# Patient Record
Sex: Male | Born: 1995 | Race: Black or African American | Hispanic: No | Marital: Single | Smoking: Current every day smoker
Health system: Southern US, Community
[De-identification: ages and names within clinical notes are randomized; demographics above are authoritative.]

---
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
## 2015-06-26 ENCOUNTER — Ambulatory Visit (INDEPENDENT_AMBULATORY_CARE_PROVIDER_SITE_OTHER): Payer: Managed Care, Other (non HMO) | Admitting: Family Medicine

## 2015-06-26 VITALS — BP 110/64 | HR 63 | Temp 98.9°F | Resp 18 | Ht 70.5 in | Wt 186.4 lb

## 2015-06-26 DIAGNOSIS — R197 Diarrhea, unspecified: Secondary | ICD-10-CM

## 2015-06-26 DIAGNOSIS — M791 Myalgia, unspecified site: Secondary | ICD-10-CM

## 2015-06-26 MED ORDER — LOPERAMIDE HCL 2 MG PO TABS: 2.0000 mg | ORAL_TABLET | Freq: Four times a day (QID) | ORAL | Status: AC | PRN

## 2015-06-26 MED ORDER — CIPROFLOXACIN HCL 250 MG PO TABS: 250.0000 mg | ORAL_TABLET | Freq: Two times a day (BID) | ORAL | Status: AC

## 2015-06-26 NOTE — Progress Notes (Signed)
This is a 19 year old is brought in by his mother because of 2 weeks of myalgia, headache, and diarrhea. He finds that whenever he eats anything, he has immediate diarrhea. He's noticed no blood in his stool. He's had no history of diarrhea, and he denies fever, sweats, chills at night. He also has minimal abdominal pain or cramps.  Patient has had mild cough and mild sore throat.  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 No other members of the family have this man's symptoms.  Patient was last in [place] 7 years ago.  He likes to play soccer. At one time he was told he had a murmur and was disqualifying from sports.  Objective: Very healthy-appearing 19 year old man in no acute distress HEENT: Unremarkable Neck: Supple no adenopathy Chest: Clear Heart: Regular, no murmur, no gallop or rub Abdomen: Active bowel sounds, no HSM, no mass, namely tender in the lower abdomen  Assessment:  Acute and persistent diarrhea with systemic signs    ICD-9-CM ICD-10-CM   1. Diarrhea, unspecified type 787.91 R19.7 Gastrointestinal Pathogen Panel PCR     ciprofloxacin (CIPRO) 250 MG tablet     loperamide (IMODIUM A-D) 2 MG tablet  2. Myalgia 729.1 M79.1 Gastrointestinal Pathogen Panel PCR     Signed, Elvina Sidle, MD

## 2015-06-26 NOTE — Patient Instructions (Signed)
Avoid heavy foods such as gravies or sauces.  Also, minimize milk and cheese.  Instead, stick to water based soups and clear liquids.   We should have results of the test in 3-4 days.  I have prescribed antibiotic and anti-diarrheal medicine.

## 2015-06-29 LAB — GASTROINTESTINAL PATHOGEN PANEL PCR
C. difficile Tox A/B, PCR: NEGATIVE
Campylobacter, PCR: NEGATIVE
Cryptosporidium, PCR: NEGATIVE
E coli (ETEC) LT/ST PCR: NEGATIVE
E coli (STEC) stx1/stx2, PCR: NEGATIVE
E coli 0157, PCR: NEGATIVE
Giardia lamblia, PCR: NEGATIVE
Norovirus, PCR: NEGATIVE
Rotavirus A, PCR: NEGATIVE
Salmonella, PCR: NEGATIVE
Shigella, PCR: NEGATIVE

## 2015-07-09 ENCOUNTER — Ambulatory Visit (INDEPENDENT_AMBULATORY_CARE_PROVIDER_SITE_OTHER): Payer: Managed Care, Other (non HMO) | Admitting: Physician Assistant

## 2015-07-09 VITALS — BP 104/60 | HR 89 | Temp 98.0°F | Resp 14 | Ht 71.0 in | Wt 183.4 lb

## 2015-07-09 DIAGNOSIS — R41840 Attention and concentration deficit: Secondary | ICD-10-CM | POA: Diagnosis not present

## 2015-07-09 MED ORDER — BUPROPION HCL ER (XL) 150 MG PO TB24: 150.0000 mg | ORAL_TABLET | Freq: Every day | ORAL | Status: AC

## 2015-07-09 NOTE — Progress Notes (Signed)
Patient ID: Jesus Olsen, male    DOB: 09/12/1996, 19 y.o.   MRN: [id number]  PCP: No PCP Per Patient  Subjective:   Chief Complaint  Patient presents with  . Stress  . Difficulty Focusing  . Depression    See screening    HPI Presents for evaluation of difficulty concentrating.  He reports that he has been struggling in school since about the 10th grade. He is easily distracted, inattentive, difficulty focusing on any assignment to completion. He feels a lot of pressure from his family to do well in school, and it's been moreso since his grade dropped in the 10th grade. He graduated from [employer] and now attends [employer]. 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 He is up at 6 am to help his younger brothers get ready for school (4, 188, 1413; his mother takes care of the 76 month old). He then goes to school, and studies until work. 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  He spends as much time as he can in [employer], in between classes, and in between school and work. He notes that he is so easily distracted that he has to turn off his phone, but he still finds himself daydreaming and watching videos on the computer. Last week he spent 12 consecutive hours in [employer], but only completed one task, that should have taken only 10 minutes.  No thoughts of harming himself or anyone else.    [facility] 07/09/2015 07/09/2015 06/26/2015  Decreased Interest 2 2 0  Down, Depressed, Hopeless 2 2 0  PHQ - 2 Score 4 4 0  Altered sleeping 2 3 -  Tired, decreased energy 3 3 -  Change in appetite 3 3 -  Feeling bad or failure about yourself  3 3 -  Trouble concentrating 3 2 -  Moving slowly or fidgety/restless 0 2 -  Suicidal thoughts 0 0 -  PHQ-9 Score 18 20 -  Difficult doing  work/chores Very difficult Very difficult -    GAD 7 : Generalized Anxiety Score 07/09/2015  Nervous, Anxious, on Edge 2  Control/stop worrying 2  Worry too much - different things 2  Trouble relaxing 1  Restless 1  Easily annoyed or irritable 2  Afraid - awful might happen 0  Total GAD 7 Score 10  Anxiety Difficulty Not difficult at all     Review of Systems  Constitutional: Positive for fatigue. Negative for fever, chills, diaphoresis, activity change, appetite change and unexpected weight change.  HENT: Negative.   Eyes: Negative.   Respiratory: Positive for chest tightness. Negative for apnea, cough, choking, shortness of breath, wheezing and stridor.   Cardiovascular: Positive for palpitations (heart beat awareness). Negative for chest pain and leg swelling.  Gastrointestinal: Negative.   Endocrine: Negative.   Genitourinary: Negative.   Musculoskeletal: Negative.   Skin: Negative.   Allergic/Immunologic: Negative.   Neurological: Negative.   Hematological: Negative.   Psychiatric/Behavioral: Positive for dysphoric mood and decreased concentration. Negative for suicidal ideas, behavioral problems, confusion, sleep disturbance, self-injury and agitation. The patient is nervous/anxious. The patient is not hyperactive.        There are no active problems to display for this patient.    Prior to Admission medications   Medication Sig Start Date End Date Taking? Authorizing  Provider  ciprofloxacin (CIPRO) 250 MG tablet Take 1 tablet (250 mg total) by mouth 2 (two) times daily. 06/26/15  Yes Elvina Sidle, MD  ibuprofen (ADVIL,MOTRIN) 200 MG tablet Take 400 mg by mouth every 6 (six) hours as needed for moderate pain.    Historical Provider, MD  ibuprofen (ADVIL,MOTRIN) 600 MG tablet Take 1 tablet (600 mg total) by mouth every 6 (six) hours as needed. Patient not taking: Reported on 06/26/2015 10/04/13   Tatyana Kirichenko, PA-C  loperamide (IMODIUM A-D) 2 MG tablet Take  1 tablet (2 mg total) by mouth 4 (four) times daily as needed for diarrhea or loose stools. Patient not taking: Reported on 07/09/2015 06/26/15   Elvina Sidle, MD  traMADol (ULTRAM) 50 MG tablet Take 1 tablet (50 mg total) by mouth every 6 (six) hours as needed. Patient not taking: Reported on 06/26/2015 10/04/13   Tatyana Kirichenko, PA-C     No Known Allergies     Objective:  Physical Exam  Constitutional: He is oriented to person, place, and time. Vital signs are normal. He appears well-developed and well-nourished. He is active and cooperative. No distress.  BP 104/60 mmHg  Pulse 89  Temp(Src) 98 F (36.7 C) (Oral)  Resp 14  Ht  (1.803 m)  Wt 183 lb 6.4 oz (83.19 kg)  BMI 25.59 kg/m2  SpO2 98%  HENT:  Head: Normocephalic and atraumatic.  Right Ear: Hearing normal.  Left Ear: Hearing normal.  Eyes: Conjunctivae are normal. No scleral icterus.  Neck: Normal range of motion. Neck supple. No thyromegaly present.  Cardiovascular: Normal rate, regular rhythm and normal heart sounds.   Pulses:      Radial pulses are 2+ on the right side, and 2+ on the left side.  Pulmonary/Chest: Effort normal and breath sounds normal.  Lymphadenopathy:       Head (right side): No tonsillar, no preauricular, no posterior auricular and no occipital adenopathy present.       Head (left side): No tonsillar, no preauricular, no posterior auricular and no occipital adenopathy present.    He has no cervical adenopathy.       Right: No supraclavicular adenopathy present.       Left: No supraclavicular adenopathy present.  Neurological: He is alert and oriented to person, place, and time. No sensory deficit.  Skin: Skin is warm, dry and intact. No rash noted. No cyanosis or erythema. Nails show no clubbing.  Psychiatric: He has a normal mood and affect. His speech is normal and behavior is normal. Judgment and thought content normal. Cognition and memory are normal.           Assessment &  Plan:   1. Attention and concentration deficit Trial of Wellbutrin which may help his mood and improve concentration/attention. Recommended that he go to the counseling center at school and schedule an appointment. He may benefit from counseling, as in psychotherapy, or perhaps just some study skills and coaching. Encouraged him to seek a job that allows him to get at least 8 hours of sleep, as he is likely also experiencing the effects of chronic sleep deprivation, which may be at the root of all his complaints. - buPROPion (WELLBUTRIN XL) 150 MG 24 hr tablet; Take 1 tablet (150 mg total) by mouth daily.  Dispense: 30 tablet; Refill: 3   Return in about 4 weeks (around 08/06/2015) for re-evaluation.   Fernande Bras, PA-C Physician Assistant-Certified [facility]

## 2015-07-09 NOTE — Patient Instructions (Signed)
Take the Wellbutrin each morning. Go to the counseling center at school and schedule an appointment to meet with one of the counselors.

## 2015-08-14 ENCOUNTER — Ambulatory Visit: Payer: Managed Care, Other (non HMO) | Admitting: Physician Assistant

## 2016-02-17 ENCOUNTER — Emergency Department (HOSPITAL_COMMUNITY)
Admission: EM | Admit: 2016-02-17 | Discharge: 2016-02-17 | Disposition: A | Discharge status: Home / Self Care | Payer: Medicaid Other | Attending: Emergency Medicine | Admitting: Emergency Medicine

## 2016-02-17 ENCOUNTER — Encounter (HOSPITAL_COMMUNITY): Payer: Self-pay | Admitting: Nurse Practitioner

## 2016-02-17 ENCOUNTER — Emergency Department (HOSPITAL_COMMUNITY): Payer: Medicaid Other

## 2016-02-17 DIAGNOSIS — Y9366 Activity, soccer: Secondary | ICD-10-CM | POA: Insufficient documentation

## 2016-02-17 DIAGNOSIS — W500XXA Accidental hit or strike by another person, initial encounter: Secondary | ICD-10-CM | POA: Insufficient documentation

## 2016-02-17 DIAGNOSIS — Y92322 Soccer field as the place of occurrence of the external cause: Secondary | ICD-10-CM | POA: Insufficient documentation

## 2016-02-17 DIAGNOSIS — F17200 Nicotine dependence, unspecified, uncomplicated: Secondary | ICD-10-CM | POA: Insufficient documentation

## 2016-02-17 DIAGNOSIS — S99921A Unspecified injury of right foot, initial encounter: Secondary | ICD-10-CM | POA: Insufficient documentation

## 2016-02-17 DIAGNOSIS — Y999 Unspecified external cause status: Secondary | ICD-10-CM | POA: Insufficient documentation

## 2016-02-17 MED ORDER — NAPROXEN 250 MG PO TABS: 500.0000 mg | ORAL_TABLET | Freq: Once | ORAL | Status: DC

## 2016-02-17 MED ORDER — PREDNISONE 10 MG (21) PO TBPK: 10.0000 mg | ORAL_TABLET | Freq: Every day | ORAL | Status: AC

## 2016-02-17 MED ORDER — METHOCARBAMOL 500 MG PO TABS: 500.0000 mg | ORAL_TABLET | Freq: Two times a day (BID) | ORAL | Status: AC

## 2016-02-17 MED ORDER — FAMOTIDINE 20 MG PO TABS: 20.0000 mg | ORAL_TABLET | Freq: Two times a day (BID) | ORAL | Status: AC

## 2016-02-17 MED ORDER — NAPROXEN 500 MG PO TABS: 500.0000 mg | ORAL_TABLET | Freq: Two times a day (BID) | ORAL | Status: AC

## 2016-02-17 MED ORDER — METHOCARBAMOL 500 MG PO TABS: 1000.0000 mg | ORAL_TABLET | Freq: Once | ORAL | Status: DC

## 2016-02-17 NOTE — ED Notes (Signed)
Declined W/C at D/C and was escorted to lobby by RN. 

## 2016-02-17 NOTE — Discharge Instructions (Signed)
Mr. Barbaraann Caobdelmaleg M Heide,  Nice meeting you! Please follow-up with orthopedics if you continue to have pain. Return to the emergency department if you develop discolorations (black/blue), numbness/tingling, inability to move foot, new/worsening symptoms. Feel better soon!  S. Lane HackerNicole Atina Feeley, PA-C

## 2016-02-17 NOTE — ED Notes (Signed)
He c/o R foot pain and swelling since he was stepped on with metal cleats yesterday. He tried ice, and icy hot with no relief.

## 2016-02-27 NOTE — ED Provider Notes (Signed)
CSN: [id number]     Arrival date & time 02/17/16  1235 History   First MD Initiated Contact with Patient 02/17/16 1440     Chief Complaint  Patient presents with  . Foot Injury   HPI   Jesus Olsen is a 20 y.o. male presenting with a 1 day history of right foot pain and swelling. He states his right foot was stepped on by another person wearing cleats during a soccer game yesterday. He describes his pain as constant, 8/10 pain scale, nonradiating, achy. He denies fevers, chills, recent travel/surgeries/periods of immobility, numbness/tingling.   History reviewed. No pertinent past medical history. History reviewed. No pertinent past surgical history. Family History  Problem Relation Age of Onset  . Arthritis Father     knee   Social History  Substance Use Topics  . Smoking status: Current Every Day Smoker  . Smokeless tobacco: Current User  . Alcohol Use: No    Review of Systems  Ten systems are reviewed and are negative for acute change except as noted in the HPI  Allergies  Review of patient's allergies indicates no known allergies.  Home Medications   Prior to Admission medications   Medication Sig Start Date End Date Taking? Authorizing Provider  buPROPion (WELLBUTRIN XL) 150 MG 24 hr tablet Take 1 tablet (150 mg total) by mouth daily. 07/09/15   Chelle Jeffery, PA-C  ciprofloxacin (CIPRO) 250 MG tablet Take 1 tablet (250 mg total) by mouth 2 (two) times daily. 06/26/15   Elvina SidleKurt Lauenstein, MD  famotidine (PEPCID) 20 MG tablet Take 1 tablet (20 mg total) by mouth 2 (two) times daily. 02/17/16   Melton KrebsSamantha Nicole Betzabe Bevans, PA-C  ibuprofen (ADVIL,MOTRIN) 200 MG tablet Take 400 mg by mouth every 6 (six) hours as needed for moderate pain.    Historical Provider, MD  ibuprofen (ADVIL,MOTRIN) 600 MG tablet Take 1 tablet (600 mg total) by mouth every 6 (six) hours as needed. Patient not taking: Reported on 06/26/2015 10/04/13   Tatyana Kirichenko, PA-C  loperamide (IMODIUM A-D) 2  MG tablet Take 1 tablet (2 mg total) by mouth 4 (four) times daily as needed for diarrhea or loose stools. Patient not taking: Reported on 07/09/2015 06/26/15   Elvina SidleKurt Lauenstein, MD  methocarbamol (ROBAXIN) 500 MG tablet Take 1 tablet (500 mg total) by mouth 2 (two) times daily. 02/17/16   Melton KrebsSamantha Nicole Valentina Alcoser, PA-C  naproxen (NAPROSYN) 500 MG tablet Take 1 tablet (500 mg total) by mouth 2 (two) times daily. 02/17/16   Melton KrebsSamantha Nicole Lether Tesch, PA-C  predniSONE (STERAPRED UNI-PAK 21 TAB) 10 MG (21) TBPK tablet Take 1 tablet (10 mg total) by mouth daily. Take 6 tabs by mouth daily  for 2 days, then 5 tabs for 2 days, then 4 tabs for 2 days, then 3 tabs for 2 days, 2 tabs for 2 days, then 1 tab by mouth daily for 2 days 02/17/16   Melton KrebsSamantha Nicole Leticia Coletta, PA-C  traMADol (ULTRAM) 50 MG tablet Take 1 tablet (50 mg total) by mouth every 6 (six) hours as needed. Patient not taking: Reported on 06/26/2015 10/04/13   Tatyana Kirichenko, PA-C   BP 112/63 mmHg  Pulse 50  Temp(Src) 98.3 F (36.8 C) (Oral)  Resp 14  SpO2 100% Physical Exam  Constitutional: He appears well-developed and well-nourished. No distress.  HENT:  Head: Normocephalic and atraumatic.  Eyes: Conjunctivae are normal. Right eye exhibits no discharge. Left eye exhibits no discharge. No scleral icterus.  Neck: No tracheal deviation present.  Cardiovascular: Normal rate, regular rhythm, normal heart sounds and intact distal pulses.  Exam reveals no gallop and no friction rub.   No murmur heard. Pulmonary/Chest: Effort normal and breath sounds normal. No respiratory distress.  Abdominal: Soft. Bowel sounds are normal. He exhibits no distension.  Musculoskeletal: Normal range of motion. He exhibits tenderness. He exhibits no edema.  Diffuse, superior right foot tenderness. Strength 5/5 LE. NVI BL.   Neurological: He is alert. Coordination normal.  Skin: Skin is warm and dry. No rash noted. He is not diaphoretic. No erythema.  Psychiatric: He has  a normal mood and affect. His behavior is normal.  Nursing note and vitals reviewed.   ED Course  Procedures  Imaging Review Dg Foot Complete Right  02/17/2016  CLINICAL DATA:  Right foot injury last night. Someone stepped on foot playing soccer. Pain over fourth and fifth metatarsals. EXAM: RIGHT FOOT COMPLETE - 3+ VIEW COMPARISON:  None. FINDINGS: There is no evidence of fracture or dislocation. There is no evidence of arthropathy or other focal bone abnormality. Soft tissues are unremarkable. IMPRESSION: Negative. Electronically Signed   By: Charlett Nose M.D.   On: 02/17/2016 13:59   I have personally reviewed and evaluated these images and lab results as part of my medical decision-making.  MDM   Final diagnoses:  Foot injury, right, initial encounter   Patient X-Ray negative for obvious fracture or dislocation. Pain managed in ED. Pt advised to follow up with orthopedics if symptoms persist for possibility of missed fracture diagnosis. Patient given ace wrap while in ED, conservative therapy recommended and discussed. Patient will be dc home & is agreeable with above plan.  Melton Krebs, PA-C 02/27/16 1436  Melene Plan, DO 02/28/16 1600

## 2017-03-17 ENCOUNTER — Encounter (HOSPITAL_COMMUNITY): Payer: Self-pay

## 2017-03-17 DIAGNOSIS — Y998 Other external cause status: Secondary | ICD-10-CM | POA: Insufficient documentation

## 2017-03-17 DIAGNOSIS — F17200 Nicotine dependence, unspecified, uncomplicated: Secondary | ICD-10-CM | POA: Insufficient documentation

## 2017-03-17 DIAGNOSIS — S1191XA Laceration without foreign body of unspecified part of neck, initial encounter: Secondary | ICD-10-CM | POA: Insufficient documentation

## 2017-03-17 DIAGNOSIS — Z79899 Other long term (current) drug therapy: Secondary | ICD-10-CM | POA: Insufficient documentation

## 2017-03-17 DIAGNOSIS — S0990XA Unspecified injury of head, initial encounter: Secondary | ICD-10-CM | POA: Insufficient documentation

## 2017-03-17 DIAGNOSIS — Y9389 Activity, other specified: Secondary | ICD-10-CM | POA: Insufficient documentation

## 2017-03-17 DIAGNOSIS — W25XXXA Contact with sharp glass, initial encounter: Secondary | ICD-10-CM | POA: Insufficient documentation

## 2017-03-17 DIAGNOSIS — Y92009 Unspecified place in unspecified non-institutional (private) residence as the place of occurrence of the external cause: Secondary | ICD-10-CM | POA: Insufficient documentation

## 2017-03-17 NOTE — ED Notes (Signed)
Bleeding controlled with gauze

## 2017-03-17 NOTE — ED Triage Notes (Signed)
Pt reports he was playing soccer, the ball hit the ceiling light and the ceiling lamp fell onto his head. He has a laceration about 1 inch behind right ear. No LOC.

## 2017-03-18 ENCOUNTER — Emergency Department (HOSPITAL_COMMUNITY): Payer: Medicaid Other

## 2017-03-18 ENCOUNTER — Emergency Department (HOSPITAL_COMMUNITY)
Admission: EM | Admit: 2017-03-18 | Discharge: 2017-03-18 | Disposition: A | Discharge status: Home / Self Care | Payer: Medicaid Other | Attending: Emergency Medicine | Admitting: Emergency Medicine

## 2017-03-18 DIAGNOSIS — S1191XA Laceration without foreign body of unspecified part of neck, initial encounter: Secondary | ICD-10-CM

## 2017-03-18 MED ORDER — LIDOCAINE HCL (PF) 1 % IJ SOLN
30.0000 mL | Freq: Once | INTRAMUSCULAR | Status: AC
  Administered 2017-03-18: 30 mL
  Filled 2017-03-18: qty 30

## 2017-03-18 NOTE — ED Notes (Signed)
NP aware of HR: 53

## 2017-03-18 NOTE — ED Provider Notes (Signed)
[facility]-EMERGENCY DEPT Provider Note   CSN: [id number] Arrival date & time: 03/17/17  2329     History   Chief Complaint Chief Complaint  Patient presents with  . Head Laceration    HPI Jesus Olsen is a 21 y.o. male.  This 21 year old who was sitting playing video games when other people in the house kicked a soccer ball hitting the lamp/ceiling fixture which broke, causing glass to ring down.  Patient had sustained a laceration behind his right ear Loss of consciousness      History reviewed. No pertinent past medical history.  There are no active problems to display for this patient.   History reviewed. No pertinent surgical history.     Home Medications    Prior to Admission medications   Medication Sig Start Date End Date Taking? Authorizing Provider  buPROPion (WELLBUTRIN XL) 150 MG 24 hr tablet Take 1 tablet (150 mg total) by mouth daily. 07/09/15   Porfirio Oar, PA-C  ciprofloxacin (CIPRO) 250 MG tablet Take 1 tablet (250 mg total) by mouth 2 (two) times daily. 06/26/15   Elvina Sidle, MD  famotidine (PEPCID) 20 MG tablet Take 1 tablet (20 mg total) by mouth 2 (two) times daily. 02/17/16   Melton Krebs, PA-C  ibuprofen (ADVIL,MOTRIN) 200 MG tablet Take 400 mg by mouth every 6 (six) hours as needed for moderate pain.    [provider]  ibuprofen (ADVIL,MOTRIN) 600 MG tablet Take 1 tablet (600 mg total) by mouth every 6 (six) hours as needed. Patient not taking: Reported on 06/26/2015 10/04/13   Jaynie Crumble, PA-C  loperamide (IMODIUM A-D) 2 MG tablet Take 1 tablet (2 mg total) by mouth 4 (four) times daily as needed for diarrhea or loose stools. Patient not taking: Reported on 07/09/2015 06/26/15   Elvina Sidle, MD  methocarbamol (ROBAXIN) 500 MG tablet Take 1 tablet (500 mg total) by mouth 2 (two) times daily. 02/17/16   Melton Krebs, PA-C  naproxen (NAPROSYN) 500 MG tablet Take 1 tablet (500 mg total) by mouth 2  (two) times daily. 02/17/16   Melton Krebs, PA-C  predniSONE (STERAPRED UNI-PAK 21 TAB) 10 MG (21) TBPK tablet Take 1 tablet (10 mg total) by mouth daily. Take 6 tabs by mouth daily  for 2 days, then 5 tabs for 2 days, then 4 tabs for 2 days, then 3 tabs for 2 days, 2 tabs for 2 days, then 1 tab by mouth daily for 2 days 02/17/16   Melton Krebs, PA-C  traMADol (ULTRAM) 50 MG tablet Take 1 tablet (50 mg total) by mouth every 6 (six) hours as needed. Patient not taking: Reported on 06/26/2015 10/04/13   Jaynie Crumble, PA-C    Family History Family History  Problem Relation Age of Onset  . Arthritis Father        knee    Social History Social History  Substance Use Topics  . Smoking status: Current Every Day Smoker  . Smokeless tobacco: Current User  . Alcohol use No     Allergies   Patient has no known allergies.   Review of Systems Review of Systems  Constitutional: Negative for fever.  Eyes: Negative for visual disturbance.  Gastrointestinal: Negative for nausea.  Skin: Positive for wound.  Neurological: Negative for dizziness and headaches.  All other systems reviewed and are negative.    Physical Exam Updated Vital Signs BP 103/61 (BP Location: Right Arm)   Pulse 77   Temp 97.8 F (36.6  C) (Oral)   Resp 20   SpO2 99%   Physical Exam  Constitutional: He appears well-developed and well-nourished. No distress.  HENT:  Head:    Right Ear: External ear normal.  Eyes: Pupils are equal, round, and reactive to light.  Neck: Normal range of motion.  Cardiovascular: Normal rate.   Pulmonary/Chest: Effort normal.  Musculoskeletal: Normal range of motion.  Neurological: He is alert.  Nursing note and vitals reviewed.    ED Treatments / Results  Labs (all labs ordered are listed, but only abnormal results are displayed) Labs Reviewed - No data to display  EKG  EKG Interpretation None       Radiology Ct Head Wo Contrast  Result  Date: 03/18/2017 CLINICAL DATA:  Lamp fell on patient's head.  Initial encounter. EXAM: CT HEAD WITHOUT CONTRAST TECHNIQUE: Contiguous axial images were obtained from the base of the skull through the vertex without intravenous contrast. COMPARISON:  None. FINDINGS: Brain: No evidence of acute infarction, hemorrhage, hydrocephalus, extra-axial collection or mass lesion/mass effect. Prominence of the ventricles and sulci reflects mild cortical volume loss. The brainstem and fourth ventricle are within normal limits. The basal ganglia are unremarkable in appearance. The cerebral hemispheres demonstrate grossly normal gray-white differentiation. No mass effect or midline shift is seen. Vascular: No hyperdense vessel or unexpected calcification. Skull: There is no evidence of fracture; visualized osseous structures are unremarkable in appearance. Sinuses/Orbits: The visualized portions of the orbits are within normal limits. The paranasal sinuses and mastoid air cells are well-aerated. Other: No significant soft tissue abnormalities are seen. IMPRESSION: 1. No evidence of traumatic intracranial injury or fracture. 2. Mild cortical volume loss noted. Electronically Signed   By: Roanna Raider M.D.   On: 03/18/2017 02:17    Procedures .Marland KitchenLaceration Repair Date/Time: 03/18/2017 3:09 AM Performed by: Earley Favor Authorized by: Earley Favor   Consent:    Consent obtained:  Verbal   Consent given by:  Patient   Risks discussed:  Pain and infection   Alternatives discussed:  No treatment Anesthesia (see MAR for exact dosages):    Anesthesia method:  Local infiltration   Local anesthetic:  Lidocaine 1% w/o epi Laceration details:    Location:  Neck   Neck location:  R posterior   Length (cm):  3   Depth (mm):  0.3 Repair type:    Repair type:  Simple Exploration:    Hemostasis achieved with:  Direct pressure   Contaminated: no   Treatment:    Area cleansed with:  Saline   Amount of cleaning:   Standard   Visualized foreign bodies/material removed: no   Skin repair:    Repair method:  Sutures   Suture size:  4-0   Suture material:  Prolene   Suture technique:  Simple interrupted   Number of sutures:  7 Approximation:    Approximation:  Close   Vermilion border: well-aligned   Post-procedure details:    Dressing:  Antibiotic ointment Comments:     This was a studder laceration with a small area of intact skin    (including critical care time)  Medications Ordered in ED Medications  lidocaine (PF) (XYLOCAINE) 1 % injection 30 mL (30 mLs Other Given by Other 03/18/17 0309)     Initial Impression / Assessment and Plan / ED Course  I have reviewed the triage vital signs and the nursing notes.  Pertinent labs & imaging results that were available during my care of the patient were reviewed by me  and considered in my medical decision making (see chart for details).      Sutures to be removed in 10 days  CT Head normal   Final Clinical Impressions(s) / ED Diagnoses   Final diagnoses:  Laceration of neck, initial encounter    New Prescriptions New Prescriptions   No medications on file     Earley FavorSchulz, Jibran Crookshanks, NP 03/18/17 [id number]    Geoffery Lyonselo, Douglas, MD 03/18/17 [phone]

## 2017-03-18 NOTE — ED Notes (Signed)
Applied Bacitracin to sutured laceration on head behind right ear per NP verbal order.

## 2017-03-18 NOTE — Discharge Instructions (Signed)
Your sutures should be removed in 10 days.  Please wash the area with soap and water daily basis as discussed.  You may shower, but I would prefer that you do not sit in a body of water with your head submerged for any length of time if you do happen to go swimming.  Please try to keep your head above water.  If you do, and inadvertently get water from the [facility] or pool into the laceration.  Please wash with soap and water.  Immediately after watch for signs of infection such as redness, pain, swelling, pus. You can have your sutures removed at any urgent care, your primary care doctor

## 2017-11-06 ENCOUNTER — Encounter (HOSPITAL_COMMUNITY): Payer: Self-pay

## 2017-11-06 ENCOUNTER — Other Ambulatory Visit: Payer: Self-pay

## 2017-11-06 ENCOUNTER — Emergency Department (HOSPITAL_COMMUNITY): Payer: Self-pay

## 2017-11-06 DIAGNOSIS — R1084 Generalized abdominal pain: Secondary | ICD-10-CM | POA: Insufficient documentation

## 2017-11-06 LAB — CBC WITH DIFFERENTIAL/PLATELET
BASOS ABS: 0 10*3/uL (ref 0.0–0.1)
BASOS PCT: 1 %
Eosinophils Absolute: 0.1 10*3/uL (ref 0.0–0.7)
Eosinophils Relative: 2 %
HEMATOCRIT: 41.4 % (ref 39.0–52.0)
Hemoglobin: 14 g/dL (ref 13.0–17.0)
Lymphocytes Relative: 49 %
Lymphs Abs: 3.1 10*3/uL (ref 0.7–4.0)
MCH: 28.7 pg (ref 26.0–34.0)
MCHC: 33.8 g/dL (ref 30.0–36.0)
MCV: 85 fL (ref 78.0–100.0)
MONO ABS: 0.5 10*3/uL (ref 0.1–1.0)
Monocytes Relative: 8 %
NEUTROS ABS: 2.5 10*3/uL (ref 1.7–7.7)
Neutrophils Relative %: 40 %
Platelets: 210 10*3/uL (ref 150–400)
RBC: 4.87 MIL/uL (ref 4.22–5.81)
RDW: 13.1 % (ref 11.5–15.5)
WBC: 6.1 10*3/uL (ref 4.0–10.5)

## 2017-11-06 LAB — URINALYSIS, ROUTINE W REFLEX MICROSCOPIC
BILIRUBIN URINE: NEGATIVE
Glucose, UA: NEGATIVE mg/dL
HGB URINE DIPSTICK: NEGATIVE
Ketones, ur: NEGATIVE mg/dL
Leukocytes, UA: NEGATIVE
NITRITE: NEGATIVE
Protein, ur: NEGATIVE mg/dL
SPECIFIC GRAVITY, URINE: 1.035 — AB (ref 1.005–1.030)
pH: 5 (ref 5.0–8.0)

## 2017-11-06 LAB — COMPREHENSIVE METABOLIC PANEL
ALBUMIN: 4.3 g/dL (ref 3.5–5.0)
ALK PHOS: 53 U/L (ref 38–126)
ALT: 9 U/L — ABNORMAL LOW (ref 17–63)
ANION GAP: 10 (ref 5–15)
AST: 19 U/L (ref 15–41)
BILIRUBIN TOTAL: 0.9 mg/dL (ref 0.3–1.2)
BUN: 16 mg/dL (ref 6–20)
CALCIUM: 9.2 mg/dL (ref 8.9–10.3)
CO2: 24 mmol/L (ref 22–32)
Chloride: 105 mmol/L (ref 101–111)
Creatinine, Ser: 1.03 mg/dL (ref 0.61–1.24)
GFR calc non Af Amer: >60 mL/min (ref >60)
GLUCOSE: 98 mg/dL (ref 65–99)
Potassium: 4.3 mmol/L (ref 3.5–5.1)
Sodium: 139 mmol/L (ref 135–145)
TOTAL PROTEIN: 7 g/dL (ref 6.5–8.1)

## 2017-11-06 LAB — LIPASE, BLOOD: LIPASE: 64 U/L — AB (ref 11–51)

## 2017-11-06 NOTE — ED Notes (Signed)
No answer when called for treatment room.  ?

## 2017-11-06 NOTE — ED Notes (Signed)
No answer in waiting room 

## 2017-11-06 NOTE — ED Provider Notes (Signed)
Patient placed in Quick Look pathway, seen and evaluated   Chief Complaint: Generalized abdominal pain, diarrhea, constipation, influenza-like illness  HPI:   Patient presents to the ED for 2 days of generalized abdominal pain.  Does report on and off constipation but now reports diarrhea.  Patient states that he did have a fever at work earlier today.  Patient reports some runny nose, chills and body aches with cough.  Has not taking first fevers prior to arrival.  Denies any urinary symptoms.  Denies any bloody stools.  Denies any vomiting.  Denies any pain right now.  ROS: Abdominal pain, rhinorrhea, cough, sore throat, nausea, vomiting, diarrhea (one)  Physical Exam:   Gen: No distress  Neuro: Awake and Alert  Skin: Warm    Focused Exam: Abdominal exam is benign.  No focal tenderness.  Abdomen is soft without distention.  Bowel sounds are present.  No CVA tenderness.  Lungs clear to auscultation bilaterally.  Heart regular rate and rhythm.  No nuchal rigidity.  No peritonitis.   Initiation of care has begun. The patient has been counseled on the process, plan, and necessity for staying for the completion/evaluation, and the remainder of the medical screening examination   Discussed with the patient that exiting the department prior to completion of the work-up is AMA and there is no guarantee that there are no emergency medical conditions present.     Rise MuLeaphart, Kenneth T, PA-C 11/06/17 1940    Tegeler, Canary Brimhristopher J, MD 11/16/17 [phone]

## 2017-11-06 NOTE — ED Notes (Signed)
No answer for triage.

## 2017-11-06 NOTE — ED Triage Notes (Signed)
Pt states that he is having generalized abd pain for the past two day, along with some constipation and then diarrhea, pt states that he began to have runny nose, chills, body aches, afebrile now.

## 2017-11-07 ENCOUNTER — Emergency Department (HOSPITAL_COMMUNITY)
Admission: EM | Admit: 2017-11-07 | Discharge: 2017-11-07 | Discharge status: Against Medical Advice | Payer: Self-pay | Attending: Emergency Medicine | Admitting: Emergency Medicine

## 2017-11-07 NOTE — ED Notes (Signed)
No answer in waiting room 

## 2019-09-01 ENCOUNTER — Emergency Department (HOSPITAL_COMMUNITY): Admission: EM | Admit: 2019-09-01 | Payer: Self-pay | Source: Home / Self Care

## 2020-06-03 ENCOUNTER — Emergency Department (HOSPITAL_COMMUNITY)
Admission: EM | Admit: 2020-06-03 | Discharge: 2020-06-03 | Disposition: A | Discharge status: Home / Self Care | Payer: Self-pay | Attending: Emergency Medicine | Admitting: Emergency Medicine

## 2020-06-03 ENCOUNTER — Other Ambulatory Visit: Payer: Self-pay

## 2020-06-03 ENCOUNTER — Encounter (HOSPITAL_COMMUNITY): Payer: Self-pay | Admitting: Emergency Medicine

## 2020-06-03 DIAGNOSIS — R1031 Right lower quadrant pain: Secondary | ICD-10-CM | POA: Insufficient documentation

## 2020-06-03 DIAGNOSIS — Z5321 Procedure and treatment not carried out due to patient leaving prior to being seen by health care provider: Secondary | ICD-10-CM | POA: Insufficient documentation

## 2020-06-03 LAB — URINALYSIS, ROUTINE W REFLEX MICROSCOPIC
Bilirubin Urine: NEGATIVE
Glucose, UA: NEGATIVE mg/dL
Hgb urine dipstick: NEGATIVE
Ketones, ur: NEGATIVE mg/dL
Leukocytes,Ua: NEGATIVE
Nitrite: NEGATIVE
Protein, ur: NEGATIVE mg/dL
Specific Gravity, Urine: 1.031 — ABNORMAL HIGH (ref 1.005–1.030)
pH: 6 (ref 5.0–8.0)

## 2020-06-03 NOTE — ED Triage Notes (Signed)
Patient complaints of right flank and RLQ pain. Patient states that it began while he was at a party. Patient is not tender on palpation, denies worsening pain with movement, no fevers or nausea. Patient endorses marijuana and cocaine use tonight. Patient also states he may be constipated.

## 2020-06-03 NOTE — ED Notes (Signed)
Called patient name in lobby for vital sigh recheck no one responded

## 2020-06-03 NOTE — ED Notes (Signed)
Called patient to take back to a room no one responded

## 2020-06-03 NOTE — ED Notes (Signed)
I called patient name to recheck vitals and no one responded 

## 2021-02-04 ENCOUNTER — Emergency Department (HOSPITAL_COMMUNITY)
Admission: EM | Admit: 2021-02-04 | Discharge: 2021-02-04 | Discharge status: Against Medical Advice | Payer: Self-pay | Attending: Emergency Medicine | Admitting: Emergency Medicine

## 2021-02-04 ENCOUNTER — Encounter (HOSPITAL_COMMUNITY): Payer: Self-pay

## 2021-02-04 ENCOUNTER — Other Ambulatory Visit: Payer: Self-pay

## 2021-02-04 ENCOUNTER — Emergency Department (HOSPITAL_COMMUNITY): Payer: Self-pay

## 2021-02-04 DIAGNOSIS — R569 Unspecified convulsions: Secondary | ICD-10-CM | POA: Insufficient documentation

## 2021-02-04 DIAGNOSIS — Z5321 Procedure and treatment not carried out due to patient leaving prior to being seen by health care provider: Secondary | ICD-10-CM | POA: Insufficient documentation

## 2021-02-04 LAB — COMPREHENSIVE METABOLIC PANEL
ALT: 37 U/L (ref 0–44)
AST: 18 U/L (ref 15–41)
Albumin: 4.4 g/dL (ref 3.5–5.0)
Alkaline Phosphatase: 56 U/L (ref 38–126)
Anion gap: 7 (ref 5–15)
BUN: 16 mg/dL (ref 6–20)
CO2: 26 mmol/L (ref 22–32)
Calcium: 9 mg/dL (ref 8.9–10.3)
Chloride: 105 mmol/L (ref 98–111)
Creatinine, Ser: 0.92 mg/dL (ref 0.61–1.24)
GFR, Estimated: >60 mL/min (ref >60)
Glucose, Bld: 99 mg/dL (ref 70–99)
Potassium: 3.8 mmol/L (ref 3.5–5.1)
Sodium: 138 mmol/L (ref 135–145)
Total Bilirubin: 0.7 mg/dL (ref 0.3–1.2)
Total Protein: 7.6 g/dL (ref 6.5–8.1)

## 2021-02-04 LAB — CBC WITH DIFFERENTIAL/PLATELET
Abs Immature Granulocytes: 0.02 10*3/uL (ref 0.00–0.07)
Basophils Absolute: 0 10*3/uL (ref 0.0–0.1)
Basophils Relative: 1 %
Eosinophils Absolute: 0.1 10*3/uL (ref 0.0–0.5)
Eosinophils Relative: 1 %
HCT: 37.5 % — ABNORMAL LOW (ref 39.0–52.0)
Hemoglobin: 11.8 g/dL — ABNORMAL LOW (ref 13.0–17.0)
Immature Granulocytes: 0 %
Lymphocytes Relative: 32 %
Lymphs Abs: 2.5 10*3/uL (ref 0.7–4.0)
MCH: 27.6 pg (ref 26.0–34.0)
MCHC: 31.5 g/dL (ref 30.0–36.0)
MCV: 87.8 fL (ref 80.0–100.0)
Monocytes Absolute: 0.6 10*3/uL (ref 0.1–1.0)
Monocytes Relative: 8 %
Neutro Abs: 4.6 10*3/uL (ref 1.7–7.7)
Neutrophils Relative %: 58 %
Platelets: 236 10*3/uL (ref 150–400)
RBC: 4.27 MIL/uL (ref 4.22–5.81)
RDW: 15.7 % — ABNORMAL HIGH (ref 11.5–15.5)
WBC: 7.8 10*3/uL (ref 4.0–10.5)
nRBC: 0 % (ref 0.0–0.2)

## 2021-02-04 LAB — ETHANOL: Alcohol, Ethyl (B): <10 mg/dL (ref <10)
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
## 2021-02-04 NOTE — ED Notes (Signed)
Pt state he is leaving. Seen walking out front door. Triage rn info

## 2021-07-26 IMAGING — CT CT HEAD W/O CM
3 series · 15 of 47 positions shown, 18 images · non-contrast
Comparison: Prior head CT 03/18/2017.

CLINICAL DATA: Seizure, nontraumatic.

EXAM:
CT HEAD WITHOUT CONTRAST
TECHNIQUE: Contiguous axial images were obtained from the base of the skull
through the vertex without intravenous contrast.

[Series 2: head wo · axial · 0.47mm/px · z∈[+1650,+1775]mm · 9 of 31 slices shown, 12 images]
[im 3/31  brain]
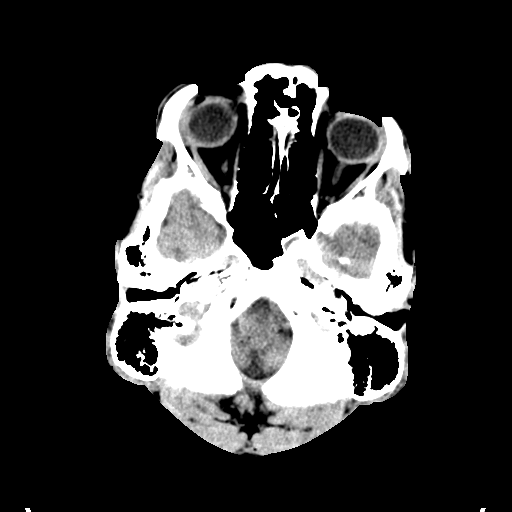
[im 3/31  bone]
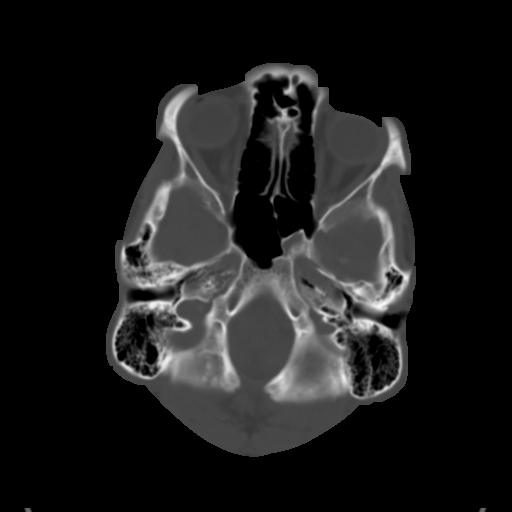
[im 6/31  brain]
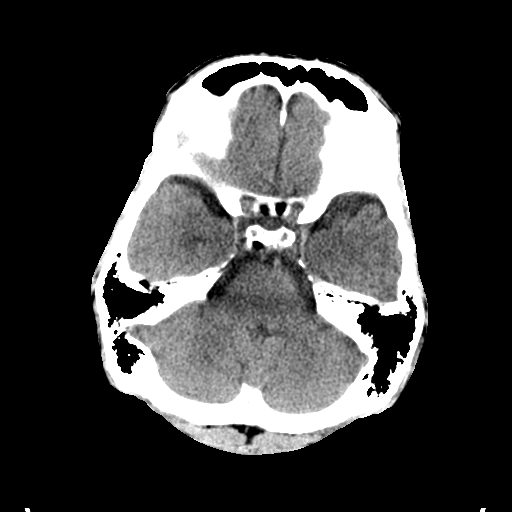
[im 9/31  brain]
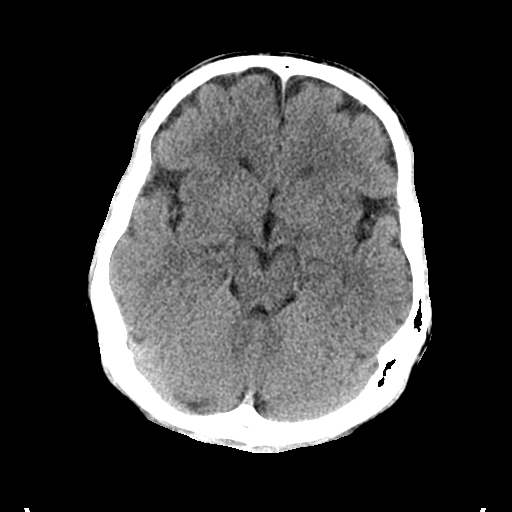
[im 12/31  brain]
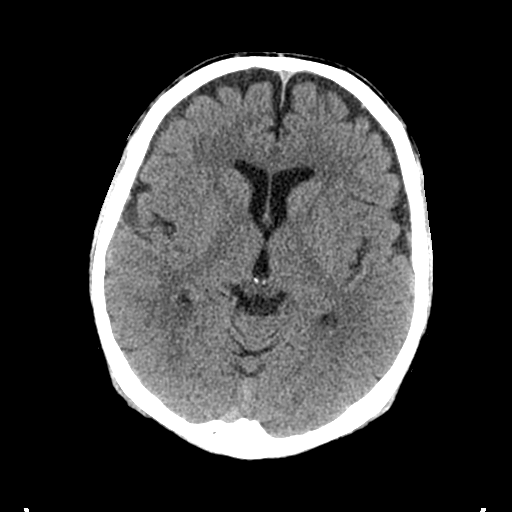
[im 16/31  brain]
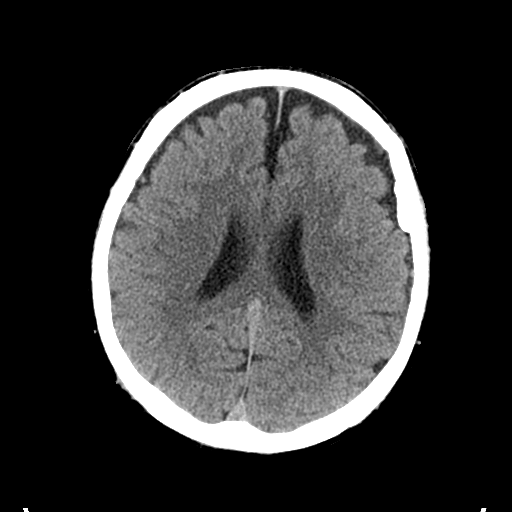
[im 16/31  bone]
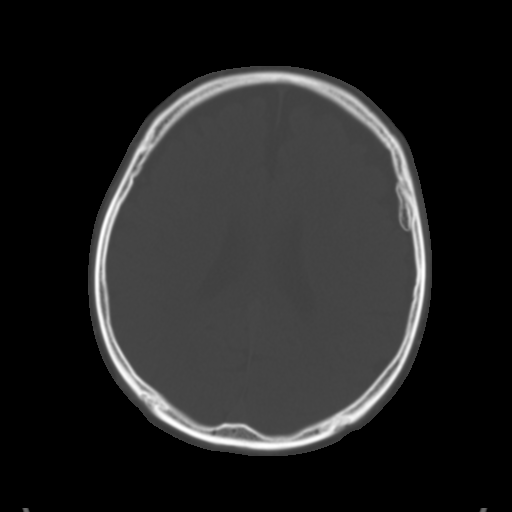
[im 19/31  brain]
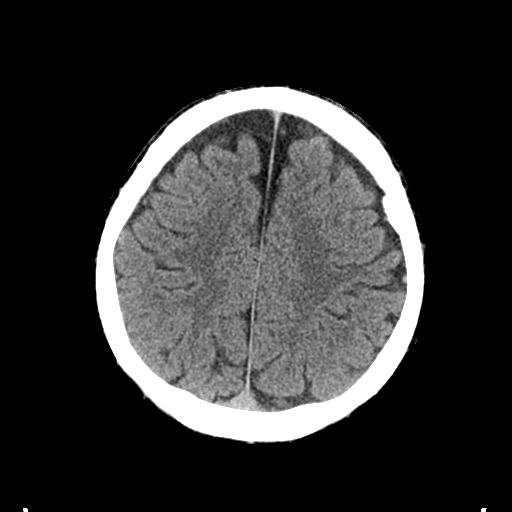
[im 22/31  brain]
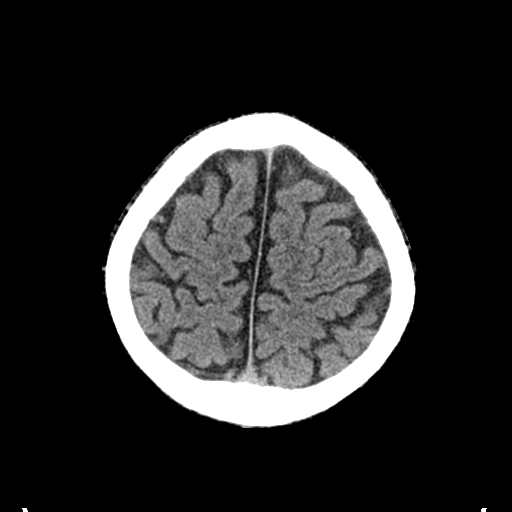
[im 25/31  brain]
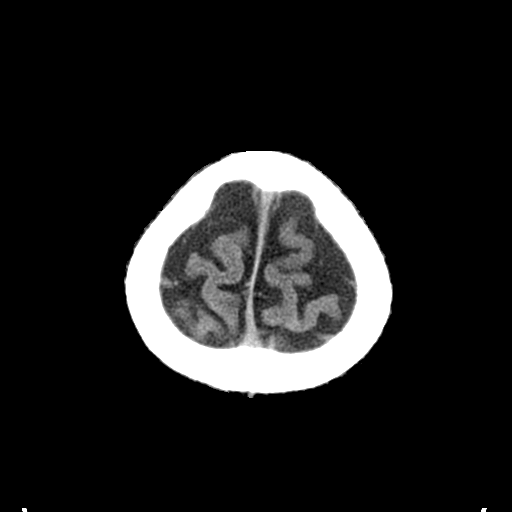
[im 28/31  brain]
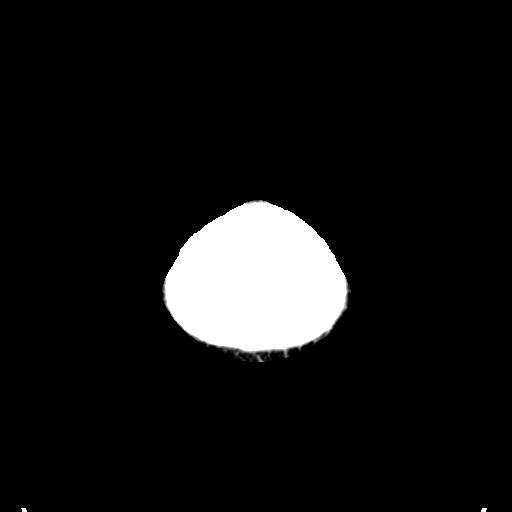
[im 28/31  bone]
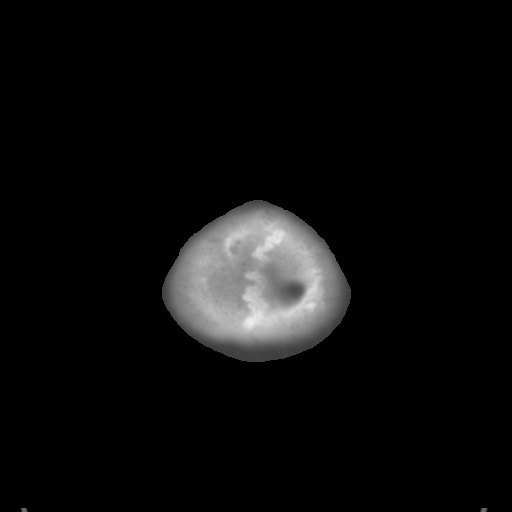

[Series 5: coronal soft tissue · coronal · 0.30mm/px · 3 of 70 slices shown]
[im 24/70  brain]
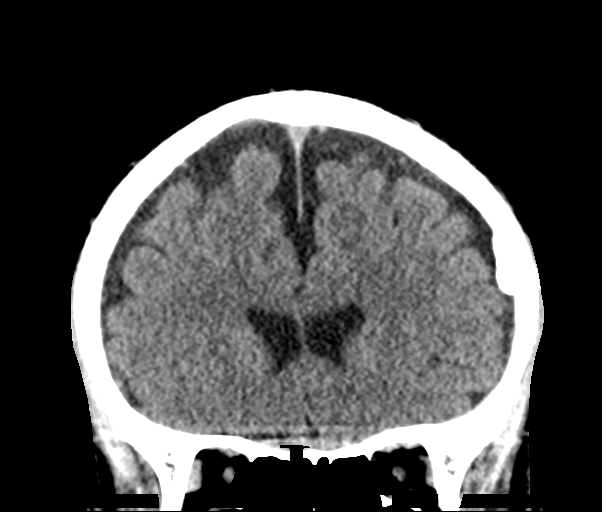
[im 31/70  brain]
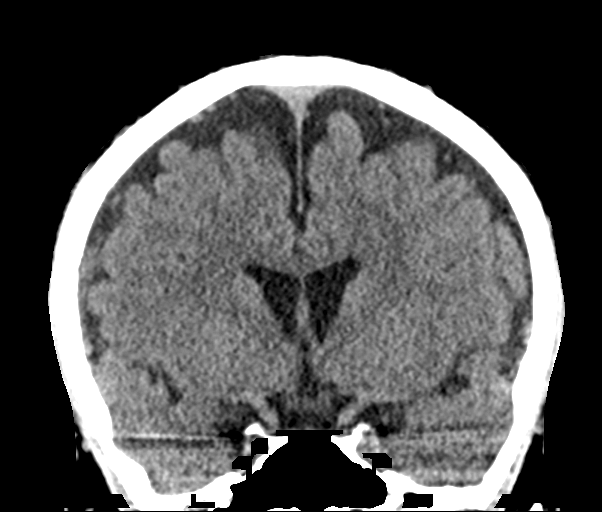
[im 39/70  brain]
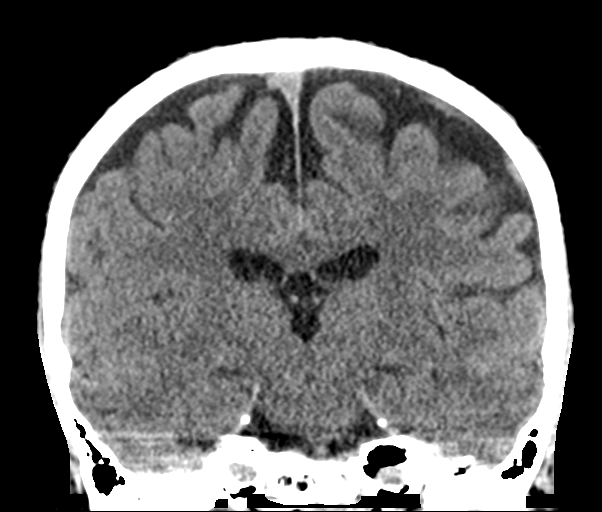

[Series 6: sagittal soft tissue · sagittal · 0.30mm/px · 3 of 61 slices shown]
[im 21/61  brain]
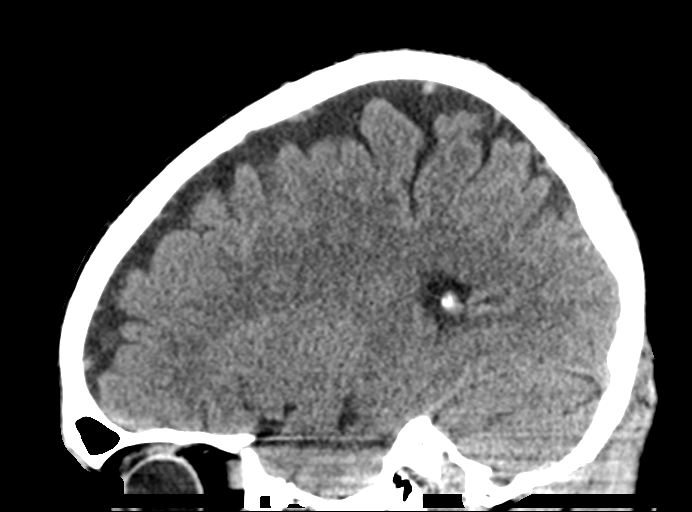
[im 31/61  brain]
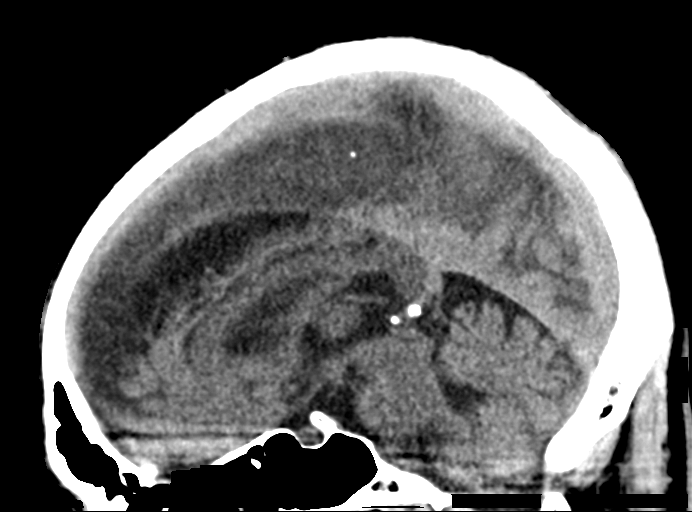
[im 41/61  brain]
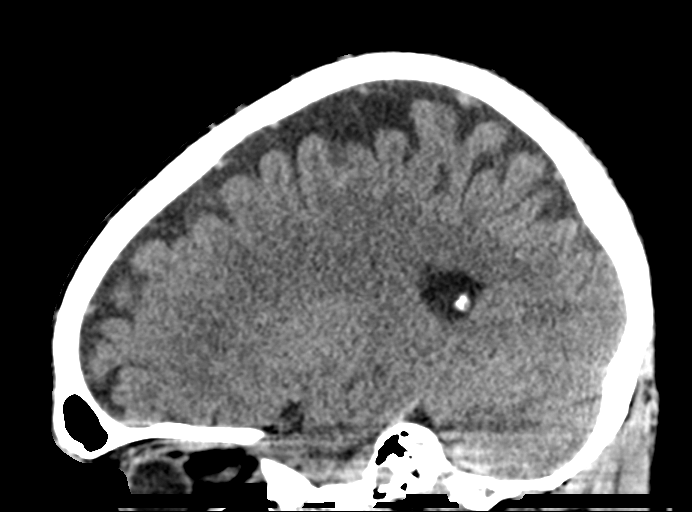

[15 of 47 positions shown; findings below may reference images not displayed]

FINDINGS: Brain:

There is no acute intracranial hemorrhage.

No demarcated cortical infarct.

No extra-axial fluid collection.

No evidence of intracranial mass.

No midline shift.

Somewhat prominent dural calcification overlying the mid left
frontal lobe, unchanged from the head CT of 03/18/2017.

Vascular: No hyperdense vessel.

Skull: Normal. Negative for fracture or focal lesion.

Sinuses/Orbits: Visualized orbits show no acute finding. Trace
bilateral ethmoid sinus mucosal thickening at the imaged levels.
IMPRESSION: Stable non-contrast CT appearance of the brain as compared to
03/18/2017. No evidence of acute intracranial abnormality.

## 2023-05-18 ENCOUNTER — Encounter (HOSPITAL_COMMUNITY): Payer: Self-pay | Admitting: *Deleted

## 2023-05-18 ENCOUNTER — Emergency Department (HOSPITAL_COMMUNITY)
Admission: EM | Admit: 2023-05-18 | Discharge: 2023-05-18 | Disposition: A | Discharge status: Home / Self Care | Payer: BC Managed Care – PPO | Attending: Emergency Medicine | Admitting: Emergency Medicine

## 2023-05-18 ENCOUNTER — Other Ambulatory Visit: Payer: Self-pay

## 2023-05-18 DIAGNOSIS — F17210 Nicotine dependence, cigarettes, uncomplicated: Secondary | ICD-10-CM | POA: Diagnosis not present

## 2023-05-18 DIAGNOSIS — K047 Periapical abscess without sinus: Secondary | ICD-10-CM | POA: Insufficient documentation

## 2023-05-18 DIAGNOSIS — R6884 Jaw pain: Secondary | ICD-10-CM | POA: Diagnosis present

## 2023-05-18 MED ORDER — AMOXICILLIN 500 MG PO CAPS: 500.0000 mg | ORAL_CAPSULE | Freq: Three times a day (TID) | ORAL | 0 refills | Status: AC

## 2023-05-18 MED ORDER — AMOXICILLIN 500 MG PO CAPS
500.0000 mg | ORAL_CAPSULE | Freq: Once | ORAL | Status: AC
  Administered 2023-05-18: 500 mg via ORAL
  Filled 2023-05-18: qty 1

## 2023-05-18 NOTE — ED Triage Notes (Signed)
States he woke up Sunday am with pain and swelling to right lower jaw, this am the pain woke him up and the swelling is worse.

## 2023-05-18 NOTE — ED Provider Notes (Signed)
[facility]EMERGENCY DEPT [facility] Emergency Department Provider Note MRN:  [id number]  Arrival date & time: 05/18/23     Chief Complaint   Jaw Pain   History of Present Illness   Jesus Olsen is a 27 y.o. year-old male with no pertinent past medical history presenting to the ED with chief complaint of general pain.  Swelling and discomfort to the right jaw.  Started with some pain to the right back lower tooth.  No fever.  Review of Systems  A thorough review of systems was obtained and all systems are negative except as noted in the HPI and PMH.   Patient's Health History   History reviewed. No pertinent past medical history.  History reviewed. No pertinent surgical history.  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 Came to the [place] in 2006.   Lives with his parents and 4 younger brothers.   Social Determinants of Health   Financial Resource Strain: Not on file  Food Insecurity: Not on file  Transportation Needs: Not on file  Physical Activity: Not on file  Stress: Not on file  Social Connections: Not on file  Intimate Partner Violence: Not on file     Physical Exam    Vitals:   05/18/23 0332  BP: 121/64  Pulse: (!) 58  Resp: 18  Temp: 98.7 F (37.1 C)  SpO2: 98%    CONSTITUTIONAL: Well-appearing, NAD NEURO/PSYCH:  Alert and oriented x 3, no focal deficits EYES:  eyes equal and reactive ENT/NECK:  no LAD, no JVD CARDIO: Regular rate, well-perfused, normal S1 and S2 PULM:  CTAB no wheezing or rhonchi GI/GU:  non-distended, non-tender MSK/SPINE:  No gross deformities, no edema SKIN:  no rash, atraumatic   *Additional and/or pertinent findings included in MDM below  Diagnostic and Interventional Summary    EKG Interpretation Date/Time:    Ventricular Rate:    PR Interval:    QRS Duration:    QT Interval:    QTC Calculation:   R Axis:      Text Interpretation:         Labs Reviewed - No data to display  No orders to display    Medications  amoxicillin (AMOXIL) capsule 500 mg (has no administration in time range)     Procedures  /  Critical Care Procedures  ED Course and Medical Decision Making  Initial Impression and Ddx Swelling and discomfort to the right jaw, looks like it is stemming from tooth #29 or possibly tooth 32.  They both had obvious  cavities and have mild tenderness.  No systemic symptoms of infection, no fever, no tenderness or induration to the submandibular space, no signs of abscess intraorally, nothing to suggest a more significant contiguous infection.  Past medical/surgical history that increases complexity of ED encounter: None  Interpretation of Diagnostics Laboratory and/or imaging options to aid in the diagnosis/care of the patient were considered.  After careful history and physical examination, it was determined that there was no indication for diagnostics at this time.  Patient Reassessment and Ultimate Disposition/Management     Discharge  Patient management required discussion with the following services or consulting groups:  None  Complexity of Problems Addressed Acute illness or  injury that poses threat of life of bodily function  Additional Data Reviewed and Analyzed Further history obtained from: Further history from spouse/family member  Additional Factors Impacting ED Encounter Risk Prescriptions  Elmer Sow. Pilar Plate, MD [facility] Emergency Medicine [facility] [email or web address]  Final Clinical Impressions(s) / ED Diagnoses     ICD-10-CM   1. Dental infection  K04.7       ED Discharge Orders          Ordered    amoxicillin (AMOXIL) 500 MG capsule  3 times daily        05/18/23 0420             Discharge Instructions Discussed with and Provided to Patient:    Discharge Instructions      You were evaluated in the Emergency Department and after careful evaluation, we did not find any emergent condition requiring admission or further testing in the hospital.  Your exam/testing today is overall reassuring.  Symptoms likely due to a tooth infection.  Take the amoxicillin antibiotics as directed.  Use Tylenol and Motrin for discomfort.  Follow-up with a dentist.  Please return to the Emergency Department if you experience any worsening of your condition.   Thank you for allowing [place] to be a part of your care.      Sabas Sous, MD 05/18/23 [phone]

## 2023-05-18 NOTE — Discharge Instructions (Signed)
You were evaluated in the Emergency Department and after careful evaluation, we did not find any emergent condition requiring admission or further testing in the hospital.  Your exam/testing today is overall reassuring.  Symptoms likely due to a tooth infection.  Take the amoxicillin antibiotics as directed.  Use Tylenol and Motrin for discomfort.  Follow-up with a dentist.  Please return to the Emergency Department if you experience any worsening of your condition.   Thank you for allowing [place] to be a part of your care.

## 2024-05-13 ENCOUNTER — Emergency Department (HOSPITAL_COMMUNITY)
Admission: EM | Admit: 2024-05-13 | Discharge: 2024-05-14 | Discharge status: Against Medical Advice | Attending: Emergency Medicine | Admitting: Emergency Medicine

## 2024-05-13 ENCOUNTER — Other Ambulatory Visit: Payer: Self-pay

## 2024-05-13 DIAGNOSIS — Z5321 Procedure and treatment not carried out due to patient leaving prior to being seen by health care provider: Secondary | ICD-10-CM | POA: Insufficient documentation

## 2024-05-13 DIAGNOSIS — R5382 Chronic fatigue, unspecified: Secondary | ICD-10-CM | POA: Diagnosis present

## 2024-05-13 NOTE — ED Triage Notes (Signed)
 Patient reports feeling  tired/no energy fatigue and dehydration this week . Denies fever or chills, respirations unlabored , ambulatory .

## 2024-05-14 NOTE — ED Notes (Signed)
 Pt called for vitals and registration with no answer
